# Patient Record
Sex: Male | Born: 1975 | Race: White | Hispanic: No | Marital: Single | Smoking: Current every day smoker
Health system: Southern US, Community
[De-identification: ages and names within clinical notes are randomized; demographics above are authoritative.]

## PROBLEM LIST (undated history)

## (undated) DIAGNOSIS — T4145XA Adverse effect of unspecified anesthetic, initial encounter: Secondary | ICD-10-CM

## (undated) DIAGNOSIS — R51 Headache: Secondary | ICD-10-CM

## (undated) DIAGNOSIS — T8859XA Other complications of anesthesia, initial encounter: Secondary | ICD-10-CM

## (undated) HISTORY — PX: HAND TENDON SURGERY: SHX663

## (undated) HISTORY — PX: TOOTH EXTRACTION: SUR596

---

## 2012-05-26 ENCOUNTER — Emergency Department (HOSPITAL_COMMUNITY)
Admission: EM | Admit: 2012-05-26 | Discharge: 2012-05-26 | Disposition: A | Discharge status: Home / Self Care | Payer: Self-pay | Attending: Emergency Medicine | Admitting: Emergency Medicine

## 2012-05-26 ENCOUNTER — Encounter (HOSPITAL_COMMUNITY): Payer: Self-pay | Admitting: *Deleted

## 2012-05-26 DIAGNOSIS — F17200 Nicotine dependence, unspecified, uncomplicated: Secondary | ICD-10-CM | POA: Insufficient documentation

## 2012-05-26 DIAGNOSIS — K029 Dental caries, unspecified: Secondary | ICD-10-CM

## 2012-05-26 DIAGNOSIS — K0889 Other specified disorders of teeth and supporting structures: Secondary | ICD-10-CM

## 2012-05-26 MED ORDER — AMOXICILLIN 500 MG PO CAPS: 500.0000 mg | ORAL_CAPSULE | Freq: Three times a day (TID) | ORAL | Status: AC

## 2012-05-26 MED ORDER — TRAMADOL HCL 50 MG PO TABS: 50.0000 mg | ORAL_TABLET | Freq: Four times a day (QID) | ORAL | Status: AC | PRN

## 2012-05-26 NOTE — ED Provider Notes (Signed)
History     CSN: [id number]  Arrival date & time 05/26/12  1191   First MD Initiated Contact with Patient 05/26/12 0935      Chief Complaint  Patient presents with  . Facial Pain    (Consider location/radiation/quality/duration/timing/severity/associated sxs/prior treatment) HPI Comments: Warren Duran is a 36 y.o. Male who presents with complaint of a pain to the right upper jaw. States pain started 1 week ago, worsening. Pt states he has hx of a 'cyst' that was removed from right nostril/sinus few years ago. States this pain is differnet, also having pain in the gum, swelling over gums. States " i have bad teeth." Pain with chewing.  Denies fever, chills, facial swelling, malaise. No medications taken.   The history is provided by the patient.    History reviewed. No pertinent past medical history.  History reviewed. No pertinent past surgical history.  History reviewed. No pertinent family history.  History  Substance Use Topics  . Smoking status: Current Everyday Smoker  . Smokeless tobacco: Not on file  . Alcohol Use: Yes     occ      Review of Systems  Constitutional: Negative for fever and chills.  HENT: Positive for dental problem. Negative for sore throat, mouth sores and neck pain.   Respiratory: Negative.   Cardiovascular: Negative.   Gastrointestinal: Negative.   Skin: Negative.   Neurological: Negative for dizziness, weakness and numbness.    Allergies  Review of patient's allergies indicates not on file.  Home Medications  No current outpatient prescriptions on file.  BP 131/88  Pulse 88  Temp 97.8 F (36.6 C) (Oral)  Resp 16  SpO2 100%  Physical Exam  Nursing note and vitals reviewed. Constitutional: He is oriented to person, place, and time. He appears well-developed and well-nourished. No distress.  HENT:       Wide spread dental decay. Multiple teeth missing, with only few left, and the ones that are left are decayed. Gingivitis. No  obvious abscess. Tender to palpation over right upper gum. Normal nasal cavity. Normal TMs bilaterlaly. Tender over right maxillary sinus.   Eyes: Conjunctivae are normal.  Neck: Neck supple.  Cardiovascular: Normal rate, regular rhythm and normal heart sounds.   Pulmonary/Chest: Effort normal and breath sounds normal. No respiratory distress. He has no wheezes. He has no rales.  Musculoskeletal: He exhibits no edema.  Lymphadenopathy:    He has no cervical adenopathy.  Neurological: He is alert and oriented to person, place, and time.  Skin: Skin is warm and dry.  Psychiatric: He has a normal mood and affect.    ED Course  Procedures (including critical care time)  10:12 AM Pt seen and examined. Poor dentition. Widespread dental decay. Tender over right upper gum. No signs of an abscess. Will start on antibiotics. Follow up with a dentist. Pt is non toxic. No distress.  1. Pain, dental   2. Dental decay       MDM          Lottie Mussel, PA 05/26/12 1623

## 2012-05-26 NOTE — ED Notes (Signed)
Pt reports 3-4 day hx of right sided maxofacial pain. States i had cyst removed from nasal cavity or it could be a tooth infection. Pt with dental carries noted. No fevers. Reports swelling.

## 2012-05-27 NOTE — ED Provider Notes (Signed)
Medical screening examination/treatment/procedure(s) were performed by non-physician practitioner and as supervising physician I was immediately available for consultation/collaboration.  Martha K Linker, MD 05/27/12 0915 

## 2012-05-28 ENCOUNTER — Encounter (HOSPITAL_COMMUNITY): Payer: Self-pay | Admitting: Emergency Medicine

## 2012-05-28 ENCOUNTER — Emergency Department (HOSPITAL_COMMUNITY)
Admission: EM | Admit: 2012-05-28 | Discharge: 2012-05-28 | Disposition: A | Discharge status: Home / Self Care | Payer: Self-pay | Attending: Emergency Medicine | Admitting: Emergency Medicine

## 2012-05-28 DIAGNOSIS — F17200 Nicotine dependence, unspecified, uncomplicated: Secondary | ICD-10-CM | POA: Insufficient documentation

## 2012-05-28 DIAGNOSIS — K047 Periapical abscess without sinus: Secondary | ICD-10-CM | POA: Insufficient documentation

## 2012-05-28 NOTE — ED Provider Notes (Signed)
History     CSN: [id number]  Arrival date & time 05/28/12  0136   First MD Initiated Contact with Patient 05/28/12 [phone]      Chief Complaint  Patient presents with  . Dental Pain    (Consider location/radiation/quality/duration/timing/severity/associated sxs/prior treatment) HPI Comments: Patient was seen and treated 2 days ago for a dental abscess.  He has horrible dentition with many, many missing teeth.  The ones that are still in his mouth which consists of left canine, upper, lower second left incisor.  Lower right first molar overall extensively decayed.  He also has extensive gum swelling at the location where the, central right incisor, upper should be  Patient is a 36 y.o. male presenting with tooth pain. The history is provided by the patient.  Dental PainThe primary symptoms include mouth pain. Primary symptoms do not include dental injury or fever. The symptoms began 3 to 5 days ago. The symptoms are worsening. The symptoms occur constantly.    History reviewed. No pertinent past medical history.  History reviewed. No pertinent past surgical history.  No family history on file.  History  Substance Use Topics  . Smoking status: Current Everyday Smoker  . Smokeless tobacco: Not on file  . Alcohol Use: Yes     occ      Review of Systems  Constitutional: Negative for fever and activity change.  HENT: Positive for dental problem. Negative for sinus pressure.   Gastrointestinal: Negative for nausea.  Neurological: Negative for dizziness.    Allergies  Review of patient's allergies indicates no known allergies.  Home Medications   Current Outpatient Rx  Name Route Sig Dispense Refill  . AMOXICILLIN 500 MG PO CAPS Oral Take 1 capsule (500 mg total) by mouth 3 (three) times daily. 21 capsule 0  . IBUPROFEN 200 MG PO TABS Oral Take 600 mg by mouth every 6 (six) hours as needed. For pain    . TRAMADOL HCL 50 MG PO TABS Oral Take 1 tablet (50 mg total) by mouth  every 6 (six) hours as needed for pain. 15 tablet 0    BP 144/83  Temp 99 F (37.2 C) (Oral)  Resp 14  SpO2 95%  Physical Exam  Constitutional: He appears well-nourished.  HENT:  Head: Normocephalic.       Patient has a bulbous abscess in the area where the, right front upper central incisor.  Should be with pain and slight swelling of to the corner of the right near  Eyes: Pupils are equal, round, and reactive to light.  Neck: Normal range of motion.  Cardiovascular: Normal rate.   Pulmonary/Chest: Effort normal.  Musculoskeletal: Normal range of motion.  Neurological: He is alert.  Skin: Skin is warm.    ED Course  Dental Date/Time: 05/28/2012 4:35 AM Performed by: Arman Filter Authorized by: Arman Filter Consent: Verbal consent obtained. Risks and benefits: risks, benefits and alternatives were discussed Consent given by: patient Patient understanding: patient states understanding of the procedure being performed Patient identity confirmed: verbally with patient Time out: Immediately prior to procedure a "time out" was called to verify the correct patient, procedure, equipment, support staff and site/side marked as required. Preparation: Patient was prepped and draped in the usual sterile fashion. Local anesthesia used: yes Local anesthetic: bupivacaine 0.5% without epinephrine and topical anesthetic Anesthetic total: 2 ml Patient sedated: no Patient tolerance: Patient tolerated the procedure well with no immediate complications. Comments: Abscess open and drained   (including critical care  time)  Labs Reviewed - No data to display No results found.   1. Dental abscess       MDM   Dental abscess A dental abscess was numbed with bupivacaine 0.5% 2 cc injected directly into the area over the right frontal central incisor and 11 blade was used to open.  The area.  A moderate amount of pertinent body material was suctioned out.  Patient felt relief after  procedure he was instructed to finish his antibiotics to use warm compresses 4-5 times a day for the next 2-3, days, and to followup with the dentist on call Timmie Foerster, NP 05/28/12 0439  Arman Filter, NP 05/28/12 [phone]

## 2012-05-28 NOTE — ED Notes (Signed)
NURSE FIRST ROUNDS: PT. SLEEPING WITH NO DISTRESS/ RESPIRATIONS UNLABORED.

## 2012-05-28 NOTE — ED Provider Notes (Signed)
Medical screening examination/treatment/procedure(s) were performed by non-physician practitioner and as supervising physician I was immediately available for consultation/collaboration.   Malayshia All, MD 05/28/12 0758 

## 2012-05-28 NOTE — ED Notes (Signed)
PT. REPORTS PERSISTENT/PROGRESSING PAIN AND SWELLING OF RIGHT UPPER MOLAR , SEEN HERE PRESCRIBED WITH ANTIBIOTIC AND PAIN MEDICATION WITH NO RELIEF.

## 2012-08-16 ENCOUNTER — Emergency Department (HOSPITAL_COMMUNITY)
Admission: EM | Admit: 2012-08-16 | Discharge: 2012-08-16 | Discharge status: Against Medical Advice | Payer: Self-pay | Attending: Emergency Medicine | Admitting: Emergency Medicine

## 2012-08-16 DIAGNOSIS — Y929 Unspecified place or not applicable: Secondary | ICD-10-CM | POA: Insufficient documentation

## 2012-08-16 DIAGNOSIS — S61409A Unspecified open wound of unspecified hand, initial encounter: Secondary | ICD-10-CM | POA: Insufficient documentation

## 2012-08-16 DIAGNOSIS — X58XXXA Exposure to other specified factors, initial encounter: Secondary | ICD-10-CM | POA: Insufficient documentation

## 2012-08-16 DIAGNOSIS — F17200 Nicotine dependence, unspecified, uncomplicated: Secondary | ICD-10-CM | POA: Insufficient documentation

## 2012-08-16 DIAGNOSIS — Y939 Activity, unspecified: Secondary | ICD-10-CM | POA: Insufficient documentation

## 2012-08-16 NOTE — ED Notes (Signed)
Pt did not answer.

## 2012-08-16 NOTE — ED Notes (Signed)
No answer

## 2012-08-17 ENCOUNTER — Encounter (HOSPITAL_COMMUNITY): Payer: Self-pay | Admitting: Physical Medicine and Rehabilitation

## 2012-08-17 ENCOUNTER — Emergency Department (HOSPITAL_COMMUNITY)
Admission: EM | Admit: 2012-08-17 | Discharge: 2012-08-17 | Disposition: A | Discharge status: Home / Self Care | Payer: Self-pay | Attending: Emergency Medicine | Admitting: Emergency Medicine

## 2012-08-17 ENCOUNTER — Emergency Department (HOSPITAL_COMMUNITY): Payer: Self-pay

## 2012-08-17 DIAGNOSIS — S62309B Unspecified fracture of unspecified metacarpal bone, initial encounter for open fracture: Secondary | ICD-10-CM | POA: Insufficient documentation

## 2012-08-17 DIAGNOSIS — S61409A Unspecified open wound of unspecified hand, initial encounter: Secondary | ICD-10-CM | POA: Insufficient documentation

## 2012-08-17 DIAGNOSIS — Y929 Unspecified place or not applicable: Secondary | ICD-10-CM | POA: Insufficient documentation

## 2012-08-17 DIAGNOSIS — W268XXA Contact with other sharp object(s), not elsewhere classified, initial encounter: Secondary | ICD-10-CM | POA: Insufficient documentation

## 2012-08-17 DIAGNOSIS — S61419A Laceration without foreign body of unspecified hand, initial encounter: Secondary | ICD-10-CM

## 2012-08-17 DIAGNOSIS — Y939 Activity, unspecified: Secondary | ICD-10-CM | POA: Insufficient documentation

## 2012-08-17 DIAGNOSIS — F17200 Nicotine dependence, unspecified, uncomplicated: Secondary | ICD-10-CM | POA: Insufficient documentation

## 2012-08-17 MED ORDER — OXYCODONE-ACETAMINOPHEN 5-325 MG PO TABS
1.0000 | ORAL_TABLET | Freq: Once | ORAL | Status: AC
  Administered 2012-08-17: 1 via ORAL
  Filled 2012-08-17: qty 1

## 2012-08-17 MED ORDER — CEFAZOLIN SODIUM 1-5 GM-% IV SOLN
1.0000 g | Freq: Once | INTRAVENOUS | Status: AC
  Administered 2012-08-17: 1 g via INTRAVENOUS
  Filled 2012-08-17: qty 50

## 2012-08-17 MED ORDER — CEPHALEXIN 500 MG PO CAPS: 500.0000 mg | ORAL_CAPSULE | Freq: Four times a day (QID) | ORAL | Status: DC

## 2012-08-17 MED ORDER — OXYCODONE-ACETAMINOPHEN 5-325 MG PO TABS: 1.0000 | ORAL_TABLET | Freq: Four times a day (QID) | ORAL | Status: DC | PRN

## 2012-08-17 NOTE — ED Provider Notes (Addendum)
History    This chart was scribed for Gwyneth Sprout, MD, MD by Smitty Pluck, ED Scribe. The patient was seen in room TR08C/TR08C and the patient's care was started at 11:11AM.   CSN: [id number]  Arrival date & time 08/17/12  1022   First MD Initiated Contact with Patient 08/17/12 1110      Chief Complaint  Patient presents with  . Laceration    (Consider location/radiation/quality/duration/timing/severity/associated sxs/prior treatment) Patient is a 36 y.o. male presenting with skin laceration. The history is provided by the patient. No language interpreter was used.  Laceration    Warren Duran is a 36 y.o. male who presents to the Emergency Department complaining of moderate hand laceration onset 1 day ago at 5 PM. Pt was cutting sheet metal and a piece slipped and cut the back of his right hand. He reports that he is unable to straighten his fingers in his right hand but he is able to wiggle them. Denies any other medical complication and any other current pain.   No past medical history on file.  No past surgical history on file.  History reviewed. No pertinent family history.  History  Substance Use Topics  . Smoking status: Current Every Day Smoker    Types: Cigarettes  . Smokeless tobacco: Not on file  . Alcohol Use: Yes     Comment: occ      Review of Systems  Constitutional: Negative for fever and chills.  Respiratory: Negative for shortness of breath.   Gastrointestinal: Negative for nausea and vomiting.  Neurological: Negative for weakness.  All other systems reviewed and are negative.    Allergies  Review of patient's allergies indicates no known allergies.  Home Medications  No current outpatient prescriptions on file.  BP 122/79  Pulse 88  Temp 98.1 F (36.7 C) (Oral)  Resp 20  SpO2 99%  Physical Exam  Nursing note and vitals reviewed. Constitutional: He is oriented to person, place, and time. He appears well-developed and  well-nourished. No distress.  HENT:  Head: Normocephalic and atraumatic.  Eyes: EOM are normal.  Neck: Neck supple. No tracheal deviation present.  Cardiovascular: Normal rate.   Pulmonary/Chest: Effort normal. No respiratory distress.  Musculoskeletal: Normal range of motion.       5 cm lac over dorsal aspect of right hand Unable to ext fingers 3 and 4 Flexion of DIP  Nl sensation of 4th digit but numbness over distal 3rd Nl thumb, 1st and 4th finger Mild bleeding Nl pulse   Neurological: He is alert and oriented to person, place, and time.  Skin: Skin is warm and dry.  Psychiatric: He has a normal mood and affect. His behavior is normal.    ED Course  Procedures (including critical care time) DIAGNOSTIC STUDIES: Oxygen Saturation is 99% on room air, normal by my interpretation.    COORDINATION OF CARE: 11:15 AM Discussed ED treatment with pt  11:29 AM Ordered:    . [COMPLETED] oxyCODONE-acetaminophen  1 tablet Oral Once       Labs Reviewed - No data to display Dg Hand Complete Right  08/17/2012  *RADIOLOGY REPORT*  Clinical Data: Laceration right hand  RIGHT HAND - COMPLETE 3+ VIEW  Comparison: None.  Findings: Irregularity of the head of the third metacarpal.  This is seen on three views views and is concerning for a fracture. There is soft tissue swelling of the dorsum of the hand.  IMPRESSION: Fracture of the head of the third metacarpal at the  MCP joint.   Original Report Authenticated By: Genevive Bi, M.D.      1. Laceration of hand with tendon involvement   2. Fracture metacarpal-open       MDM  patient with a laceration to the dorsal surface of his right hand. There is evidence of extensor tendon damage to the fourth and third finger, and decreased sensation to the third distal digit. He is neurovascularly intact with a normal pulse. Tetanus shot is up-to-date. Plain films pending. This occurred approximately 12 hours ago.  12:11 PM Plain film shows a  fracture of the head of the third metacarpal at the MCP joint making this an open fracture. Patient was given 1 g of Ancef and hand surgery was consult.  12:36 PM Spoke with Dr. Mina Marble and due to age of the injury he suggested wash out and f/u.  Will clean wound splint and have f/u.  I personally performed the services described in this documentation, which was scribed in my presence.  The recorded information has been reviewed and considered.       Gwyneth Sprout, MD 08/17/12 1136  Gwyneth Sprout, MD 08/17/12 1212  Gwyneth Sprout, MD 08/17/12 1610  Gwyneth Sprout, MD 08/17/12 1244

## 2012-08-17 NOTE — ED Provider Notes (Signed)
Medical screening examination/treatment/procedure(s) were conducted as a shared visit with non-physician practitioner(s) and myself.  I personally evaluated the patient during the encounter   Gwyneth Sprout, MD 08/17/12 [phone]

## 2012-08-17 NOTE — ED Notes (Signed)
Hand elevated on pillows.

## 2012-08-17 NOTE — ED Notes (Signed)
Provider at bedside.  Washing out wound.

## 2012-08-17 NOTE — ED Notes (Signed)
Ortho called to apply splint. 

## 2012-08-17 NOTE — ED Notes (Signed)
Friend to bedside

## 2012-08-17 NOTE — ED Provider Notes (Signed)
Patient seen by Dr Anitra Lauth.  I was asked to repair laceration of right dorsal hand.  Per Dr Anitra Lauth, laceration is >12 hours old and is overlying fracture.  She has spoken with hand surgeon who requests loose approximation of skin.    LACERATION REPAIR Performed by: Trixie Dredge B Authorized by: Trixie Dredge B Consent: Verbal consent obtained. Risks and benefits: risks, benefits and alternatives were discussed Consent given by: patient Patient identity confirmed: provided demographic data Prepped and Draped in normal sterile fashion Wound explored  Laceration Location: right dorsal hand  Laceration Length: 5cm  No Foreign Bodies seen or palpated  Anesthesia: local infiltration  Local anesthetic: lidocaine 2% no epinephrine  Anesthetic total: 12 ml  Irrigation method: syringe irrigation, in part with combiguard Amount of cleaning: extensive, approximately 1L used  Skin closure: 4-0 ethilon  Number of sutures: 9  Technique: simple interrupted, very loosely approximated.    Patient tolerance: Patient tolerated the procedure well with no immediate complications.   [place], [place] 08/17/12 1359

## 2012-08-17 NOTE — ED Notes (Signed)
Pt was cutting sheet metal and fell on sheet metal and cut back of right hand across 3 fingers, , limited movement of fingers, dressing applied in triage, last tetanus shot 4 years ago

## 2012-08-17 NOTE — ED Notes (Signed)
Pt c/o tingling at fingers

## 2012-08-17 NOTE — Progress Notes (Signed)
Orthopedic Tech Progress Note Patient Details:  Warren Duran 1976/10/01 [id number] Spoke with ER doctor about specific orders for this patient. Doctor requested splint that would keep fingers straight and stabilize. Decided to splint ant buddy tape 3rd and 4th digits and apply Sugartong splint extending up to fingertips. Dressing applied over laceration to protect before applying splint.   Ortho Devices Type of Ortho Device: Sugartong splint Ortho Device/Splint Location: Right UE Ortho Device/Splint Interventions: Application   Asia R Thompson 08/17/2012, 1:58 PM

## 2012-08-17 NOTE — ED Notes (Signed)
Pt clothing removed. And instructed to stay NPO

## 2012-08-17 NOTE — ED Notes (Signed)
Ortho at bedside applying splint

## 2012-08-17 NOTE — ED Notes (Signed)
Pt presents to department for evaluation of R hand laceration. States he was cutting sheet metal when a piece slipped and cut his hand. 2 inch laceration noted to top of hand, bleeding upon arrival. Pt states numbness/tingling to finger. Able to wiggle digits. He is alert and oriented x4.

## 2012-08-17 NOTE — ED Notes (Signed)
Pt is in X-ray at this time.

## 2012-08-17 NOTE — ED Notes (Signed)
Pt informed that he will have to find a ride home after being medicated. Pt agrees

## 2012-08-17 NOTE — ED Notes (Signed)
Neosporin and sterile dressing applied to right hand.

## 2012-08-20 NOTE — Progress Notes (Signed)
I called three times this evening- no answer.  I left instructions on voice mail- arrival time, NPO, [place] parking, no jewelry, lotions, powders, colones, medications he may take- Percocet and Keflex  And SS phone number.

## 2012-08-21 ENCOUNTER — Other Ambulatory Visit: Payer: Self-pay | Admitting: Orthopedic Surgery

## 2012-08-21 ENCOUNTER — Encounter (HOSPITAL_COMMUNITY): Payer: Self-pay | Admitting: Anesthesiology

## 2012-08-21 ENCOUNTER — Encounter (HOSPITAL_COMMUNITY): Payer: Self-pay

## 2012-08-21 ENCOUNTER — Ambulatory Visit (HOSPITAL_COMMUNITY)
Admission: RE | Admit: 2012-08-21 | Discharge: 2012-08-22 | Disposition: A | Discharge status: Home / Self Care | Payer: Self-pay | Source: Ambulatory Visit | Attending: Orthopedic Surgery | Admitting: Orthopedic Surgery

## 2012-08-21 ENCOUNTER — Ambulatory Visit (HOSPITAL_COMMUNITY): Payer: Self-pay | Admitting: Anesthesiology

## 2012-08-21 ENCOUNTER — Encounter (HOSPITAL_COMMUNITY)
Admission: RE | Disposition: A | Discharge status: Home / Self Care | Payer: Self-pay | Source: Ambulatory Visit | Attending: Orthopedic Surgery

## 2012-08-21 DIAGNOSIS — S62309B Unspecified fracture of unspecified metacarpal bone, initial encounter for open fracture: Secondary | ICD-10-CM

## 2012-08-21 DIAGNOSIS — X58XXXA Exposure to other specified factors, initial encounter: Secondary | ICD-10-CM | POA: Insufficient documentation

## 2012-08-21 DIAGNOSIS — S61409A Unspecified open wound of unspecified hand, initial encounter: Secondary | ICD-10-CM | POA: Insufficient documentation

## 2012-08-21 DIAGNOSIS — S62339A Displaced fracture of neck of unspecified metacarpal bone, initial encounter for closed fracture: Secondary | ICD-10-CM | POA: Insufficient documentation

## 2012-08-21 HISTORY — DX: Other complications of anesthesia, initial encounter: T88.59XA

## 2012-08-21 HISTORY — DX: Headache: R51

## 2012-08-21 HISTORY — PX: I & D EXTREMITY: SHX5045

## 2012-08-21 HISTORY — DX: Adverse effect of unspecified anesthetic, initial encounter: T41.45XA

## 2012-08-21 LAB — CBC
HCT: 42.1 % (ref 39.0–52.0)
MCH: 32.9 pg (ref 26.0–34.0)
MCV: 94.8 fL (ref 78.0–100.0)
Platelets: 267 10*3/uL (ref 150–400)
RBC: 4.44 MIL/uL (ref 4.22–5.81)
RDW: 13.1 % (ref 11.5–15.5)

## 2012-08-21 SURGERY — IRRIGATION AND DEBRIDEMENT EXTREMITY
Anesthesia: General | Site: Hand | Laterality: Right | Wound class: Clean

## 2012-08-21 MED ORDER — ENOXAPARIN SODIUM 30 MG/0.3ML ~~LOC~~ SOLN: 30.0000 mg | Freq: Two times a day (BID) | SUBCUTANEOUS | Status: DC

## 2012-08-21 MED ORDER — PROPOFOL 10 MG/ML IV BOLUS
INTRAVENOUS | Status: DC | PRN
  Administered 2012-08-21: 170 mg via INTRAVENOUS
  Administered 2012-08-21: 30 mg via INTRAVENOUS

## 2012-08-21 MED ORDER — ONDANSETRON HCL 4 MG/2ML IJ SOLN: 4.0000 mg | Freq: Four times a day (QID) | INTRAMUSCULAR | Status: DC | PRN

## 2012-08-21 MED ORDER — OXYCODONE-ACETAMINOPHEN 5-325 MG PO TABS: 1.0000 | ORAL_TABLET | ORAL | Status: DC | PRN

## 2012-08-21 MED ORDER — MUPIROCIN 2 % EX OINT
TOPICAL_OINTMENT | Freq: Once | CUTANEOUS | Status: AC
  Administered 2012-08-21: 1 via NASAL

## 2012-08-21 MED ORDER — MUPIROCIN 2 % EX OINT
TOPICAL_OINTMENT | CUTANEOUS | Status: AC
  Administered 2012-08-21: 1 via NASAL
  Filled 2012-08-21: qty 22

## 2012-08-21 MED ORDER — DIPHENHYDRAMINE HCL 25 MG PO CAPS: 25.0000 mg | ORAL_CAPSULE | Freq: Four times a day (QID) | ORAL | Status: DC | PRN

## 2012-08-21 MED ORDER — OXYCODONE HCL 5 MG PO TABS
5.0000 mg | ORAL_TABLET | Freq: Once | ORAL | Status: AC | PRN
  Administered 2012-08-21: 5 mg via ORAL

## 2012-08-21 MED ORDER — MIDAZOLAM HCL 5 MG/5ML IJ SOLN
INTRAMUSCULAR | Status: DC | PRN
  Administered 2012-08-21: 2 mg via INTRAVENOUS

## 2012-08-21 MED ORDER — HYDROMORPHONE HCL PF 1 MG/ML IJ SOLN
0.5000 mg | INTRAMUSCULAR | Status: DC | PRN
  Administered 2012-08-21 – 2012-08-22 (×2): 1 mg via INTRAVENOUS
  Filled 2012-08-21 (×2): qty 1

## 2012-08-21 MED ORDER — OXYCODONE HCL 5 MG/5ML PO SOLN: 5.0000 mg | Freq: Once | ORAL | Status: AC | PRN

## 2012-08-21 MED ORDER — DEXTROSE-NACL 5-0.9 % IV SOLN
INTRAVENOUS | Status: DC
  Administered 2012-08-22: via INTRAVENOUS

## 2012-08-21 MED ORDER — FENTANYL CITRATE 0.05 MG/ML IJ SOLN
INTRAMUSCULAR | Status: DC | PRN
  Administered 2012-08-21: 50 ug via INTRAVENOUS
  Administered 2012-08-21: 100 ug via INTRAVENOUS
  Administered 2012-08-21 (×2): 50 ug via INTRAVENOUS

## 2012-08-21 MED ORDER — CEFAZOLIN SODIUM 1-5 GM-% IV SOLN
INTRAVENOUS | Status: AC
  Filled 2012-08-21: qty 50

## 2012-08-21 MED ORDER — ONDANSETRON HCL 4 MG/2ML IJ SOLN
INTRAMUSCULAR | Status: DC | PRN
  Administered 2012-08-21: 4 mg via INTRAVENOUS

## 2012-08-21 MED ORDER — HYDROMORPHONE HCL PF 1 MG/ML IJ SOLN
0.2500 mg | INTRAMUSCULAR | Status: DC | PRN
  Administered 2012-08-21 (×3): 0.5 mg via INTRAVENOUS

## 2012-08-21 MED ORDER — OXYCODONE-ACETAMINOPHEN 5-325 MG PO TABS
1.0000 | ORAL_TABLET | ORAL | Status: DC | PRN
  Administered 2012-08-22 (×2): 2 via ORAL
  Filled 2012-08-21 (×2): qty 2

## 2012-08-21 MED ORDER — BUPIVACAINE HCL (PF) 0.25 % IJ SOLN
INTRAMUSCULAR | Status: DC | PRN
  Administered 2012-08-21: 10 mL

## 2012-08-21 MED ORDER — CHLORHEXIDINE GLUCONATE 4 % EX LIQD: 60.0000 mL | Freq: Once | CUTANEOUS | Status: DC

## 2012-08-21 MED ORDER — FENTANYL CITRATE 0.05 MG/ML IJ SOLN: 50.0000 ug | Freq: Once | INTRAMUSCULAR | Status: DC

## 2012-08-21 MED ORDER — ONDANSETRON HCL 4 MG PO TABS: 4.0000 mg | ORAL_TABLET | Freq: Four times a day (QID) | ORAL | Status: DC | PRN

## 2012-08-21 MED ORDER — BUPIVACAINE-EPINEPHRINE PF 0.25-1:200000 % IJ SOLN
INTRAMUSCULAR | Status: AC
  Filled 2012-08-21: qty 30

## 2012-08-21 MED ORDER — SODIUM CHLORIDE 0.9 % IR SOLN
Status: DC | PRN
  Administered 2012-08-21: 1000 mL

## 2012-08-21 MED ORDER — HYDROMORPHONE HCL PF 1 MG/ML IJ SOLN
INTRAMUSCULAR | Status: AC
  Filled 2012-08-21: qty 1

## 2012-08-21 MED ORDER — LIDOCAINE HCL (CARDIAC) 20 MG/ML IV SOLN
INTRAVENOUS | Status: DC | PRN
  Administered 2012-08-21: 100 mg via INTRAVENOUS

## 2012-08-21 MED ORDER — LACTATED RINGERS IV SOLN
INTRAVENOUS | Status: DC
  Administered 2012-08-21: 16:00:00 via INTRAVENOUS

## 2012-08-21 MED ORDER — OXYCODONE HCL 5 MG PO TABS
ORAL_TABLET | ORAL | Status: AC
  Filled 2012-08-21: qty 1

## 2012-08-21 MED ORDER — MIDAZOLAM HCL 2 MG/2ML IJ SOLN: 1.0000 mg | INTRAMUSCULAR | Status: DC | PRN

## 2012-08-21 MED ORDER — CEFAZOLIN SODIUM 1-5 GM-% IV SOLN
INTRAVENOUS | Status: DC | PRN
  Administered 2012-08-21: 1 g via INTRAVENOUS

## 2012-08-21 MED ORDER — PROMETHAZINE HCL 25 MG/ML IJ SOLN: 6.2500 mg | INTRAMUSCULAR | Status: DC | PRN

## 2012-08-21 MED ORDER — ENOXAPARIN SODIUM 30 MG/0.3ML ~~LOC~~ SOLN
30.0000 mg | Freq: Two times a day (BID) | SUBCUTANEOUS | Status: DC
  Administered 2012-08-22: 30 mg via SUBCUTANEOUS
  Filled 2012-08-21 (×3): qty 0.3

## 2012-08-21 SURGICAL SUPPLY — 57 items
BANDAGE CONFORM 2  STR LF (GAUZE/BANDAGES/DRESSINGS) IMPLANT
BANDAGE ELASTIC 3 VELCRO ST LF (GAUZE/BANDAGES/DRESSINGS) ×2 IMPLANT
BANDAGE ELASTIC 4 VELCRO ST LF (GAUZE/BANDAGES/DRESSINGS) ×2 IMPLANT
BANDAGE GAUZE ELAST BULKY 4 IN (GAUZE/BANDAGES/DRESSINGS) ×2 IMPLANT
BIT DRILL 1.1 MINI QC NONSTRL (BIT) ×2 IMPLANT
BNDG CMPR 9X4 STRL LF SNTH (GAUZE/BANDAGES/DRESSINGS) ×1
BNDG ESMARK 4X9 LF (GAUZE/BANDAGES/DRESSINGS) ×2 IMPLANT
CLOTH BEACON ORANGE TIMEOUT ST (SAFETY) ×2 IMPLANT
CORDS BIPOLAR (ELECTRODE) ×2 IMPLANT
COVER SURGICAL LIGHT HANDLE (MISCELLANEOUS) ×2 IMPLANT
CUFF TOURNIQUET SINGLE 18IN (TOURNIQUET CUFF) ×2 IMPLANT
DECANTER SPIKE VIAL GLASS SM (MISCELLANEOUS) ×2 IMPLANT
DRAIN PENROSE 1/4X12 LTX STRL (WOUND CARE) IMPLANT
DRAPE OEC MINIVIEW 54X84 (DRAPES) ×2 IMPLANT
DRAPE SURG 17X23 STRL (DRAPES) ×2 IMPLANT
DRSG PAD ABDOMINAL 8X10 ST (GAUZE/BANDAGES/DRESSINGS) IMPLANT
DURAPREP 26ML APPLICATOR (WOUND CARE) IMPLANT
ELECT REM PT RETURN 9FT ADLT (ELECTROSURGICAL)
ELECTRODE REM PT RTRN 9FT ADLT (ELECTROSURGICAL) IMPLANT
GAUZE PACKING IODOFORM 1/4X5 (PACKING) IMPLANT
GAUZE XEROFORM 1X8 LF (GAUZE/BANDAGES/DRESSINGS) ×2 IMPLANT
GLOVE BIO SURGEON STRL SZ8.5 (GLOVE) ×2 IMPLANT
GOWN PREVENTION PLUS XLARGE (GOWN DISPOSABLE) ×2 IMPLANT
GOWN STRL NON-REIN LRG LVL3 (GOWN DISPOSABLE) ×2 IMPLANT
HANDPIECE INTERPULSE COAX TIP (DISPOSABLE)
KIT BASIN OR (CUSTOM PROCEDURE TRAY) ×2 IMPLANT
KIT ROOM TURNOVER OR (KITS) ×2 IMPLANT
MANIFOLD NEPTUNE II (INSTRUMENTS) IMPLANT
NEEDLE HYPO 25GX1X1/2 BEV (NEEDLE) ×2 IMPLANT
NEEDLE HYPO 25X1 1.5 SAFETY (NEEDLE) IMPLANT
NS IRRIG 1000ML POUR BTL (IV SOLUTION) ×2 IMPLANT
PACK ORTHO EXTREMITY (CUSTOM PROCEDURE TRAY) ×2 IMPLANT
PAD ARMBOARD 7.5X6 YLW CONV (MISCELLANEOUS) ×4 IMPLANT
PAD CAST 4YDX4 CTTN HI CHSV (CAST SUPPLIES) ×2 IMPLANT
PADDING CAST ABS 4INX4YD NS (CAST SUPPLIES) ×1
PADDING CAST ABS COTTON 4X4 ST (CAST SUPPLIES) ×1 IMPLANT
PADDING CAST COTTON 4X4 STRL (CAST SUPPLIES) ×4
SCREW 1.5X18MM (Screw) ×2 IMPLANT
SCREW BN 18X1.5XST NONLOCK (Screw) ×1 IMPLANT
SCREW NL 1.5X13 (Screw) ×2 IMPLANT
SET HNDPC FAN SPRY TIP SCT (DISPOSABLE) IMPLANT
SPONGE GAUZE 4X4 12PLY (GAUZE/BANDAGES/DRESSINGS) ×2 IMPLANT
SPONGE LAP 18X18 X RAY DECT (DISPOSABLE) ×2 IMPLANT
SUCTION FRAZIER TIP 10 FR DISP (SUCTIONS) ×2 IMPLANT
SUT FIBERWIRE 3-0 18 TAPR NDL (SUTURE) ×2
SUT VICRYL 3 0 (SUTURE) ×2 IMPLANT
SUT VICRYL RAPIDE 4/0 PS 2 (SUTURE) ×2 IMPLANT
SUTURE FIBERWR 3-0 18 TAPR NDL (SUTURE) ×1 IMPLANT
SYR 20CC LL (SYRINGE) IMPLANT
SYR CONTROL 10ML LL (SYRINGE) ×2 IMPLANT
TOWEL OR 17X24 6PK STRL BLUE (TOWEL DISPOSABLE) ×2 IMPLANT
TOWEL OR 17X26 10 PK STRL BLUE (TOWEL DISPOSABLE) ×2 IMPLANT
TUBE ANAEROBIC SPECIMEN COL (MISCELLANEOUS) IMPLANT
TUBE CONNECTING 12X1/4 (SUCTIONS) ×2 IMPLANT
UNDERPAD 30X30 INCONTINENT (UNDERPADS AND DIAPERS) ×2 IMPLANT
WATER STERILE IRR 1000ML POUR (IV SOLUTION) ×2 IMPLANT
YANKAUER SUCT BULB TIP NO VENT (SUCTIONS) ×2 IMPLANT

## 2012-08-21 NOTE — Brief Op Note (Signed)
08/21/2012  5:23 PM  PATIENT:  Warren Duran  36 y.o. male  PRE-OPERATIVE DIAGNOSIS:  RIGHT HAND DORSAL LACERATION  POST-OPERATIVE DIAGNOSIS:  RIGHT HAND DORSAL LACERATION  PROCEDURE:  Procedure(s) (LRB) with comments: IRRIGATION AND DEBRIDEMENT EXTREMITY (Right) - EXPLORATION/REPAIR DORSAL LACERATION OF RIGHT HAND, open reduction internal fixation right metacarpel.  SURGEON:  Surgeon(s) and Role:    * Marlowe Shores, MD - Primary  PHYSICIAN ASSISTANT: {  ASSISTANTS:  none  ANESTHESIA:   general  EBL:     BLOOD ADMINISTERED:none  DRAINS: none   LOCAL MEDICATIONS USED:  MARCAINE   10cc  SPECIMEN:  No Specimen  DISPOSITION OF SPECIMEN:  N/A  COUNTS:  YES  TOURNIQUET:  * Missing tourniquet times found for documented tourniquets in log:  70057 *  DICTATION: .Other Dictation: Dictation Number [id number]  PLAN OF CARE: Admit for overnight observation  PATIENT DISPOSITION:  PACU - hemodynamically stable.   Delay start of Pharmacological VTE agent (>24hrs) due to surgical blood loss or risk of bleeding: yes

## 2012-08-21 NOTE — Progress Notes (Signed)
Patient reports being homeless without a place to go tonight. Left message for Frann Rider, RN Case Manager. Contacted Dr. Gypsy Balsam and Dr. Mina Marble and made them aware of same.

## 2012-08-21 NOTE — Discharge Instructions (Signed)
D/c in am 11/14

## 2012-08-21 NOTE — Anesthesia Postprocedure Evaluation (Signed)
  Anesthesia Post-op Note  Patient: Warren Duran  Procedure(s) Performed: Procedure(s) (LRB) with comments: IRRIGATION AND DEBRIDEMENT EXTREMITY (Right) - EXPLORATION/REPAIR DORSAL LACERATION OF RIGHT HAND, open reduction internal fixation right metacarpel.  Patient Location: PACU  Anesthesia Type:General  Level of Consciousness: awake, alert  and oriented  Airway and Oxygen Therapy: Patient Spontanous Breathing  Post-op Pain: mild  Post-op Assessment: Post-op Vital signs reviewed and Patient's Cardiovascular Status Stable  Post-op Vital Signs: stable  Complications: No apparent anesthesia complications

## 2012-08-21 NOTE — Transfer of Care (Signed)
Immediate Anesthesia Transfer of Care Note  Patient: Warren Duran  Procedure(s) Performed: Procedure(s) (LRB) with comments: IRRIGATION AND DEBRIDEMENT EXTREMITY (Right) - EXPLORATION/REPAIR DORSAL LACERATION OF RIGHT HAND, open reduction internal fixation right metacarpel.  Patient Location: PACU  Anesthesia Type:General  Level of Consciousness: awake, alert  and oriented  Airway & Oxygen Therapy: Patient Spontanous Breathing and Patient connected to nasal cannula oxygen  Post-op Assessment: Report given to PACU RN, Post -op Vital signs reviewed and stable and Patient moving all extremities X 4  Post vital signs: Reviewed and stable  Complications: No apparent anesthesia complications

## 2012-08-21 NOTE — Progress Notes (Signed)
Called patient and request that he contact short stay. Initial contact made by [facility] and patient informed them he would arrive by 13:15. No answer when called went straight to VM

## 2012-08-21 NOTE — Anesthesia Preprocedure Evaluation (Addendum)
Anesthesia Evaluation  Patient identified by MRN, date of birth, ID band Patient awake    Reviewed: Allergy & Precautions, H&P , NPO status , Patient's Chart, lab work & pertinent test results  Airway Mallampati: I TM Distance: >3 FB Neck ROM: Full    Dental   Pulmonary          Cardiovascular Rhythm:Regular Rate:Normal     Neuro/Psych  Headaches,    GI/Hepatic   Endo/Other    Renal/GU      Musculoskeletal   Abdominal   Peds  Hematology   Anesthesia Other Findings   Reproductive/Obstetrics                          Anesthesia Physical Anesthesia Plan  ASA: II  Anesthesia Plan: General   Post-op Pain Management:    Induction: Intravenous  Airway Management Planned: LMA  Additional Equipment:   Intra-op Plan:   Post-operative Plan: Extubation in OR  Informed Consent: I have reviewed the patients History and Physical, chart, labs and discussed the procedure including the risks, benefits and alternatives for the proposed anesthesia with the patient or authorized representative who has indicated his/her understanding and acceptance.     Plan Discussed with: CRNA and Surgeon  Anesthesia Plan Comments:         Anesthesia Quick Evaluation

## 2012-08-21 NOTE — Progress Notes (Signed)
Spoke  With [employer] in Dr. Ronie Spies office about patient's living conditioins, [place] with others around him but no one with him to help out and no transportation.  She stated surgery would have to be rescheduled unless social work could arrange something because patient could not spend the night in the hospital and could not go home alone. Spoke with Jodie, social work, she will not be able to arrange anything at this short notice so patient will need to reschedule and call social work ahead of time to make arrangements.  Pam from holding area called with Dr. Mina Marble at her side and states that Dr. Mina Marble will admit patient 23 hour obs.

## 2012-08-21 NOTE — Progress Notes (Signed)
Clinical Social Work Department BRIEF PSYCHOSOCIAL ASSESSMENT 08/21/2012  Patient:  Warren Duran, Warren Duran     Account Number:  [id number]     Admit date:  08/21/2012  Clinical Social Worker:  Illene Silver  Date/Time:  08/21/2012 10:14 PM  Referred by:  RN  Date Referred:  08/21/2012 Referred for  Homelessness   Other Referral:   Interview type:  Patient Other interview type:    PSYCHOSOCIAL DATA Living Status:  OTHER Admitted from facility:   Level of care:   Primary support name:   Primary support relationship to patient:   Degree of support available:   Pt has no local support.  Sister in [place] with whom he plans to live in future.  Pt unable to live with sister now because he would not be able to get to his f/u appts.    CURRENT CONCERNS Current Concerns  Other - See comment   Other Concerns:   Pt is homeless.  Lives in "[place], [place]."  MD concerned about pt d/c to woods and caring for his hand, per referring RN.    SOCIAL WORK ASSESSMENT / PLAN Received referral from [facility] RN.  Pt homeless and nowhere to go at d/c.  Pt states that he is not allowed to go back to [employer].  Pt has no local friends/family with whom he can stay.  Pt has f/u appts. in [place], and would not be able to get to them [place].  CSW to determine if pt is eligible for [employer] or financial assistance through [facility] office to pay for ST lodging.   Assessment/plan status:  Psychosocial Support/Ongoing Assessment of Needs Other assessment/ plan:   CSW will f/u with pt prior to his d/c on ? 08/22/12.   Information/referral to community resources:    PATIENT'S/FAMILY'S RESPONSE TO PLAN OF CARE: Pt very pleasant.  He is very thankful that he was able to get his hand fixed.

## 2012-08-21 NOTE — H&P (Signed)
Warren Duran is an 36 y.o. male.   Chief Complaint: right hand laceration HPI: as above with deep laceration to dorsal aspect of right hand with loss of extension  Past Medical History  Diagnosis Date  . Complication of anesthesia     patient may fight back with anesthesia  . Headache     migraines last one 1 yr ago    Past Surgical History  Procedure Date  . Tooth extraction >2 yrs ago for last one     10 years ago for original surgery  . Hand tendon surgery     left hand    Family History  Problem Relation Age of Onset  . Cancer - Lung Mother   . Other Other   . Heart disease Other   . Cancer - Lung Other   . Other Sister    Social History:  reports that he has been smoking Cigarettes.  He has a 24 pack-year smoking history. He does not have any smokeless tobacco history on file. He reports that he uses illicit drugs (Marijuana) about once per week. He reports that he does not drink alcohol.  Allergies: No Known Allergies  Medications Prior to Admission  Medication Sig Dispense Refill  . cephALEXin (KEFLEX) 500 MG capsule Take 500 mg by mouth 4 (four) times daily. Started 11.9.13 for 7 days ending 11.16.13      . oxyCODONE-acetaminophen (PERCOCET/ROXICET) 5-325 MG per tablet Take 1 tablet by mouth every 6 (six) hours as needed for pain.  15 tablet  0    Results for orders placed during the hospital encounter of 08/21/12 (from the past 48 hour(s))  CBC     Status: Normal   Collection Time   08/21/12  2:38 PM      Component Value Range Comment   WBC 8.6  4.0 - 10.5 K/uL    RBC 4.44  4.22 - 5.81 MIL/uL    Hemoglobin 14.6  13.0 - 17.0 g/dL    HCT 16.1  09.6 - 04.5 %    MCV 94.8  78.0 - 100.0 fL    MCH 32.9  26.0 - 34.0 pg    MCHC 34.7  30.0 - 36.0 g/dL    RDW 40.9  81.1 - 91.4 %    Platelets 267  150 - 400 K/uL    No results found.  Review of Systems  All other systems reviewed and are negative.    There were no vitals taken for this visit. Physical Exam    Constitutional: He is oriented to person, place, and time. He appears well-developed and well-nourished.  HENT:  Head: Normocephalic and atraumatic.  Cardiovascular: Normal rate.   Respiratory: Effort normal.  Musculoskeletal:       Right hand: He exhibits laceration.       Hands: Neurological: He is alert and oriented to person, place, and time.  Psychiatric: He has a normal mood and affect. His behavior is normal. Judgment and thought content normal.     Assessment/Plan As above  Plan explore and repair  Tanis Burnley A 08/21/2012, 3:36 PM

## 2012-08-21 NOTE — Preoperative (Signed)
Beta Blockers   Reason not to administer Beta Blockers:Not Applicable 

## 2012-08-21 NOTE — Op Note (Signed)
See note [phone]

## 2012-08-22 ENCOUNTER — Encounter (HOSPITAL_COMMUNITY): Payer: Self-pay | Admitting: Orthopedic Surgery

## 2012-08-22 MED ORDER — OXYCODONE-ACETAMINOPHEN 5-325 MG PO TABS: 1.0000 | ORAL_TABLET | ORAL | Status: DC | PRN

## 2012-08-22 NOTE — Op Note (Signed)
Warren Duran, Warren Duran             ACCOUNT NO.:  [id number]  MEDICAL RECORD NO.:  [id number]  LOCATION:  5N11C                        FACILITY:  [facility]  PHYSICIAN:  Artist Pais. Quinn Bartling, M.D.DATE OF BIRTH:  19-May-1976  DATE OF PROCEDURE:  08/21/2012 DATE OF DISCHARGE:                              OPERATIVE REPORT   PREOPERATIVE DIAGNOSIS:  Deep laceration, dorsal aspect, right hand.  POSTOPERATIVE DIAGNOSIS:  Deep laceration, dorsal aspect, right hand.  PROCEDURE:  Exploration with intra-articular fracture fixation of long metacarpal fracture with two 1.5 mm screws and extensor tendon repairs x2, long and ring finger.  SURGEON:  Artist Pais. Mina Marble, MD  ASSISTANT:  None.  ANESTHESIA:  General.  COMPLICATIONS:  No complications.  DRAINS:  No drains.  DESCRIPTION OF PROCEDURE:  The patient taken to the operating suite after induction of general anesthesia.  Right upper extremity was prepped and draped in usual sterile fashion.  An Esmarch was used to exsanguinate the limb.  Tourniquet inflated to 250 mmHg.  At this point in time, the previously sutured laceration dorsal was opened and had the sutures removed.  The flap was retracted proximally.  Dissection was carried down to the long metacarpal of the intra-articular fracture at the metacarpal head neck junction, extending to the articular surface, transverse in nature.  The fracture site was debrided of clot.  It was fixed with two 1.5 mm screws from dorsal to volar.  At this point in time, the capsule overlying the MCP joint was repaired with 3-0 undyed Vicryl and the extensor mechanism using 3-0 FiberWire x3 horizontal mattress sutures.  The same procedure with regards to the tendon repair was performed to the ring finger.  The wound was then thoroughly irrigated.  Hemostasis was achieved with bipolar cautery and loosely closed with 4-0 Vicryl Rapide.  Xeroform, 4x4s, and a volar splint was applied.  The patient tolerated  the procedure well and went to recovery room in stable fashion.     Artist Pais Mina Marble, M.D.    MAW/MEDQ  D:  08/21/2012  T:  08/22/2012  Job:  [id number]
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
## 2012-08-27 ENCOUNTER — Ambulatory Visit: Payer: Self-pay | Attending: Orthopedic Surgery | Admitting: *Deleted

## 2012-08-27 DIAGNOSIS — M25649 Stiffness of unspecified hand, not elsewhere classified: Secondary | ICD-10-CM | POA: Insufficient documentation

## 2012-08-27 DIAGNOSIS — M25549 Pain in joints of unspecified hand: Secondary | ICD-10-CM | POA: Insufficient documentation

## 2012-08-27 DIAGNOSIS — IMO0001 Reserved for inherently not codable concepts without codable children: Secondary | ICD-10-CM | POA: Insufficient documentation

## 2012-08-29 ENCOUNTER — Ambulatory Visit: Payer: Self-pay | Admitting: Occupational Therapy

## 2012-09-03 ENCOUNTER — Ambulatory Visit: Payer: Self-pay | Admitting: Occupational Therapy

## 2012-09-17 ENCOUNTER — Ambulatory Visit: Payer: Self-pay | Attending: Orthopedic Surgery | Admitting: Occupational Therapy

## 2012-09-17 DIAGNOSIS — IMO0001 Reserved for inherently not codable concepts without codable children: Secondary | ICD-10-CM | POA: Insufficient documentation

## 2012-09-17 DIAGNOSIS — M25649 Stiffness of unspecified hand, not elsewhere classified: Secondary | ICD-10-CM | POA: Insufficient documentation

## 2012-09-17 DIAGNOSIS — M25549 Pain in joints of unspecified hand: Secondary | ICD-10-CM | POA: Insufficient documentation

## 2012-09-18 ENCOUNTER — Encounter: Payer: Self-pay | Admitting: Occupational Therapy

## 2012-09-24 ENCOUNTER — Encounter: Payer: Self-pay | Admitting: Occupational Therapy

## 2012-09-26 ENCOUNTER — Ambulatory Visit: Payer: Self-pay | Admitting: Occupational Therapy

## 2012-09-30 ENCOUNTER — Encounter: Payer: Self-pay | Admitting: Occupational Therapy

## 2012-10-03 ENCOUNTER — Encounter: Payer: Self-pay | Admitting: Occupational Therapy

## 2012-12-26 ENCOUNTER — Emergency Department (HOSPITAL_COMMUNITY)
Admission: EM | Admit: 2012-12-26 | Discharge: 2012-12-27 | Disposition: A | Discharge status: Home / Self Care | Payer: Self-pay | Attending: Emergency Medicine | Admitting: Emergency Medicine

## 2012-12-26 ENCOUNTER — Encounter (HOSPITAL_COMMUNITY): Payer: Self-pay | Admitting: Emergency Medicine

## 2012-12-26 DIAGNOSIS — Z8679 Personal history of other diseases of the circulatory system: Secondary | ICD-10-CM | POA: Insufficient documentation

## 2012-12-26 DIAGNOSIS — Z23 Encounter for immunization: Secondary | ICD-10-CM | POA: Insufficient documentation

## 2012-12-26 DIAGNOSIS — F1010 Alcohol abuse, uncomplicated: Secondary | ICD-10-CM | POA: Insufficient documentation

## 2012-12-26 DIAGNOSIS — S0990XA Unspecified injury of head, initial encounter: Secondary | ICD-10-CM | POA: Insufficient documentation

## 2012-12-26 DIAGNOSIS — F17200 Nicotine dependence, unspecified, uncomplicated: Secondary | ICD-10-CM | POA: Insufficient documentation

## 2012-12-26 DIAGNOSIS — W03XXXA Other fall on same level due to collision with another person, initial encounter: Secondary | ICD-10-CM | POA: Insufficient documentation

## 2012-12-26 DIAGNOSIS — IMO0002 Reserved for concepts with insufficient information to code with codable children: Secondary | ICD-10-CM

## 2012-12-26 DIAGNOSIS — Y929 Unspecified place or not applicable: Secondary | ICD-10-CM | POA: Insufficient documentation

## 2012-12-26 DIAGNOSIS — Y939 Activity, unspecified: Secondary | ICD-10-CM | POA: Insufficient documentation

## 2012-12-26 DIAGNOSIS — S0180XA Unspecified open wound of other part of head, initial encounter: Secondary | ICD-10-CM | POA: Insufficient documentation

## 2012-12-26 NOTE — ED Notes (Signed)
Upon exam pt has a large deep laceration over his left eyebrow, abrasions noted to left arm and hand  Unknown LOC  Pt is homeless
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
## 2012-12-27 ENCOUNTER — Emergency Department (HOSPITAL_COMMUNITY): Payer: Self-pay

## 2012-12-27 ENCOUNTER — Encounter (HOSPITAL_COMMUNITY): Payer: Self-pay | Admitting: Radiology

## 2012-12-27 MED ORDER — TETANUS-DIPHTH-ACELL PERTUSSIS 5-2.5-18.5 LF-MCG/0.5 IM SUSP
0.5000 mL | Freq: Once | INTRAMUSCULAR | Status: AC
  Administered 2012-12-27: 0.5 mL via INTRAMUSCULAR
  Filled 2012-12-27: qty 0.5

## 2012-12-27 MED ORDER — CEPHALEXIN 500 MG PO CAPS: 500.0000 mg | ORAL_CAPSULE | Freq: Four times a day (QID) | ORAL | Status: DC
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
## 2012-12-27 NOTE — ED Provider Notes (Signed)
Medical screening examination/treatment/procedure(s) were performed by non-physician practitioner and as supervising physician I was immediately available for consultation/collaboration.  Mercy Malena, MD 12/27/12 0631 

## 2012-12-27 NOTE — ED Notes (Signed)
Pt. Ambulated down the hall and back to his room without difficulty. Pt. Steady gait on his feet.

## 2013-06-23 ENCOUNTER — Emergency Department (HOSPITAL_COMMUNITY)
Admission: EM | Admit: 2013-06-23 | Discharge: 2013-06-24 | Disposition: A | Discharge status: Home / Self Care | Payer: Self-pay | Attending: Emergency Medicine | Admitting: Emergency Medicine

## 2013-06-23 ENCOUNTER — Encounter (HOSPITAL_COMMUNITY): Payer: Self-pay | Admitting: Family Medicine

## 2013-06-23 DIAGNOSIS — Z792 Long term (current) use of antibiotics: Secondary | ICD-10-CM | POA: Insufficient documentation

## 2013-06-23 DIAGNOSIS — Z79899 Other long term (current) drug therapy: Secondary | ICD-10-CM | POA: Insufficient documentation

## 2013-06-23 DIAGNOSIS — H6590 Unspecified nonsuppurative otitis media, unspecified ear: Secondary | ICD-10-CM | POA: Insufficient documentation

## 2013-06-23 DIAGNOSIS — H9319 Tinnitus, unspecified ear: Secondary | ICD-10-CM | POA: Insufficient documentation

## 2013-06-23 DIAGNOSIS — F17200 Nicotine dependence, unspecified, uncomplicated: Secondary | ICD-10-CM | POA: Insufficient documentation

## 2013-06-23 NOTE — ED Notes (Signed)
Patient states that he has had an earache for the past 3 weeks.

## 2013-06-24 MED ORDER — OFLOXACIN 0.3 % OP SOLN
5.0000 [drp] | Freq: Two times a day (BID) | OPHTHALMIC | Status: DC
  Administered 2013-06-24: 5 [drp] via OTIC
  Filled 2013-06-24: qty 5

## 2013-06-24 MED ORDER — OFLOXACIN 0.3 % OT SOLN
5.0000 [drp] | Freq: Two times a day (BID) | OTIC | Status: DC
  Filled 2013-06-24: qty 5

## 2013-06-24 MED ORDER — OFLOXACIN 0.3 % OT SOLN
5.0000 [drp] | Freq: Two times a day (BID) | OTIC | Status: DC
Start: 2013-06-24 — End: 2013-06-24

## 2013-06-24 MED ORDER — AMOXICILLIN 500 MG PO CAPS: 500.0000 mg | ORAL_CAPSULE | Freq: Three times a day (TID) | ORAL | Status: DC
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
## 2013-06-24 NOTE — ED Provider Notes (Signed)
Medical screening examination/treatment/procedure(s) were performed by non-physician practitioner and as supervising physician I was immediately available for consultation/collaboration.  Tayton Decaire R. Ofelia Podolski, MD 06/24/13 0711 

## 2013-07-04 ENCOUNTER — Emergency Department (HOSPITAL_COMMUNITY)
Admission: EM | Admit: 2013-07-04 | Discharge: 2013-07-04 | Disposition: A | Discharge status: Home / Self Care | Payer: Self-pay | Attending: Emergency Medicine | Admitting: Emergency Medicine

## 2013-07-04 ENCOUNTER — Encounter (HOSPITAL_COMMUNITY): Payer: Self-pay | Admitting: Emergency Medicine

## 2013-07-04 DIAGNOSIS — Y929 Unspecified place or not applicable: Secondary | ICD-10-CM | POA: Insufficient documentation

## 2013-07-04 DIAGNOSIS — W503XXA Accidental bite by another person, initial encounter: Secondary | ICD-10-CM

## 2013-07-04 DIAGNOSIS — F17200 Nicotine dependence, unspecified, uncomplicated: Secondary | ICD-10-CM | POA: Insufficient documentation

## 2013-07-04 DIAGNOSIS — Y9389 Activity, other specified: Secondary | ICD-10-CM | POA: Insufficient documentation

## 2013-07-04 DIAGNOSIS — X58XXXA Exposure to other specified factors, initial encounter: Secondary | ICD-10-CM | POA: Insufficient documentation

## 2013-07-04 DIAGNOSIS — S51809A Unspecified open wound of unspecified forearm, initial encounter: Secondary | ICD-10-CM | POA: Insufficient documentation

## 2013-07-04 MED ORDER — CEPHALEXIN 500 MG PO CAPS: 500.0000 mg | ORAL_CAPSULE | Freq: Four times a day (QID) | ORAL | Status: DC

## 2013-07-04 MED ORDER — AMOXICILLIN-POT CLAVULANATE 875-125 MG PO TABS
1.0000 | ORAL_TABLET | Freq: Once | ORAL | Status: AC
  Administered 2013-07-04: 1 via ORAL
  Filled 2013-07-04: qty 1

## 2013-07-04 MED ORDER — NAPROXEN 500 MG PO TABS
500.0000 mg | ORAL_TABLET | Freq: Once | ORAL | Status: AC
  Administered 2013-07-04: 500 mg via ORAL
  Filled 2013-07-04: qty 1

## 2013-07-04 MED ORDER — BACITRACIN ZINC 500 UNIT/GM EX OINT
1.0000 "application " | TOPICAL_OINTMENT | Freq: Once | CUTANEOUS | Status: AC
  Administered 2013-07-04: 1 via TOPICAL
  Filled 2013-07-04: qty 0.9
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
## 2013-10-08 ENCOUNTER — Encounter (HOSPITAL_COMMUNITY): Payer: Self-pay | Admitting: Emergency Medicine

## 2013-10-08 ENCOUNTER — Emergency Department (HOSPITAL_COMMUNITY)
Admission: EM | Admit: 2013-10-08 | Discharge: 2013-10-09 | Disposition: A | Discharge status: Home / Self Care | Payer: Self-pay | Attending: Emergency Medicine | Admitting: Emergency Medicine

## 2013-10-08 DIAGNOSIS — F10929 Alcohol use, unspecified with intoxication, unspecified: Secondary | ICD-10-CM

## 2013-10-08 DIAGNOSIS — F1010 Alcohol abuse, uncomplicated: Secondary | ICD-10-CM | POA: Insufficient documentation

## 2013-10-08 DIAGNOSIS — F17200 Nicotine dependence, unspecified, uncomplicated: Secondary | ICD-10-CM | POA: Insufficient documentation

## 2013-10-08 LAB — BASIC METABOLIC PANEL
CO2: 25 mEq/L (ref 19–32)
Chloride: 103 mEq/L (ref 96–112)
Creatinine, Ser: 0.7 mg/dL (ref 0.50–1.35)
Glucose, Bld: 96 mg/dL (ref 70–99)

## 2013-10-08 LAB — CBC
HCT: 39.2 % (ref 39.0–52.0)
Hemoglobin: 13.7 g/dL (ref 13.0–17.0)
MCV: 96.3 fL (ref 78.0–100.0)
Platelets: 234 10*3/uL (ref 150–400)
RBC: 4.07 MIL/uL — ABNORMAL LOW (ref 4.22–5.81)
WBC: 8.1 10*3/uL (ref 4.0–10.5)

## 2013-10-08 LAB — ETHANOL: Alcohol, Ethyl (B): 378 mg/dL — ABNORMAL HIGH (ref 0–11)
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
## 2013-10-08 NOTE — ED Notes (Signed)
Pt has been evaluated by an EP provider. Pt on monitor, stable at the monitor, airway intact.
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
## 2013-10-08 NOTE — ED Provider Notes (Signed)
CSN: [id number]     Arrival date & time 10/08/13  2014 History   First MD Initiated Contact with Patient 10/08/13 2104     Chief Complaint  Patient presents with  . Alcohol Intoxication   (Consider location/radiation/quality/duration/timing/severity/associated sxs/prior Treatment) Patient is a 37 y.o. male presenting with intoxication.  Alcohol Intoxication   37 yo male found asleep outside of a gas station. Arrived to ED via EMS. Patient is incontinent of urine and appears intoxicated. Patient is responsive to noxious stimuli. Airway appears intact. Patient non-verbal, Level 5 caveat secondary to intoxication.  Past Medical History  Diagnosis Date  . Complication of anesthesia     patient may fight back with anesthesia  . Headache(784.0)     migraines last one 1 yr ago   Past Surgical History  Procedure Laterality Date  . Tooth extraction  >2 yrs ago for last one     10 years ago for original surgery  . Hand tendon surgery      left hand  . I&d extremity  08/21/2012    Procedure: IRRIGATION AND DEBRIDEMENT EXTREMITY;  Surgeon: Marlowe Shores, MD;  Location: [facility] OR;  Service: Orthopedics;  Laterality: Right;  EXPLORATION/REPAIR DORSAL LACERATION OF RIGHT HAND, open reduction internal fixation right metacarpel.   Family History  Problem Relation Age of Onset  . Cancer - Lung Mother   . Other Other   . Heart disease Other   . Cancer - Lung Other   . Other Sister    History  Substance Use Topics  . Smoking status: Current Every Day Smoker -- 1.00 packs/day for 24 years    Types: Cigarettes  . Smokeless tobacco: Not on file     Comment: declines  . Alcohol Use: Yes     Comment: Occasionally    Review of Systems  Unable to perform ROS: Patient nonverbal    Allergies  Review of patient's allergies indicates no known allergies.  Home Medications  No current outpatient prescriptions on file. BP 98/68  Pulse 117  Temp(Src) 97.8 F (36.6 C) (Oral)  Resp 19  SpO2  97% Physical Exam  Nursing note and vitals reviewed. Constitutional: He appears well-developed and well-nourished. No distress.  HENT:  Head: Normocephalic and atraumatic.  Eyes: Conjunctivae are normal. Pupils are equal, round, and reactive to light. No scleral icterus.  Cardiovascular: Normal rate and regular rhythm.  Exam reveals no gallop and no friction rub.   No murmur heard. Pulmonary/Chest: Effort normal and breath sounds normal. No respiratory distress.  Musculoskeletal: Normal range of motion. He exhibits no edema.  Neurological: He is alert. He has normal strength.  Skin: Skin is warm and dry. No rash noted. He is not diaphoretic.    ED Course  Procedures (including critical care time) Labs Review Labs Reviewed  CBC - Abnormal; Notable for the following:    RBC 4.07 (*)    All other components within normal limits  BASIC METABOLIC PANEL - Abnormal; Notable for the following:    Potassium 3.4 (*)    All other components within normal limits  ETHANOL - Abnormal; Notable for the following:    Alcohol, Ethyl (B) 378 (*)    All other components within normal limits  GLUCOSE, CAPILLARY   Imaging Review No results found.  EKG Interpretation   None       MDM   1. Intoxication    Patient appears to be intoxicated. Patient does not verbally communicate. Patient airway intact without. Level 5  caveat secondary to intoxication. Patient placed on cardiac monitor and pulse ox. Will get CBG. Plan to monitor and discharge patient after he sleeps off intoxication.    Patient signed over to Dr. Dione Booze.         Rudene Anda, PA-C 10/09/13 [phone]

## 2013-10-08 NOTE — ED Provider Notes (Signed)
  Face-to-face evaluation   History: Patient found outside intoxicated  Physical exam: Temperature normal in emergency department. He is attempted, but responds to noxious stimuli  Medical screening examination/treatment/procedure(s) were conducted as a shared visit with non-physician practitioner(s) and myself.  I personally evaluated the patient during the encounter  Flint Melter, MD 10/09/13 1755

## 2013-10-09 NOTE — Discharge Instructions (Signed)
Alcohol Intoxication Alcohol intoxication occurs when the amount of alcohol that a person has consumed impairs his or her ability to mentally and physically function. Alcohol directly impairs the normal chemical activity of the brain. Drinking large amounts of alcohol can lead to changes in mental function and behavior, and it can cause many physical effects that can be harmful.  Alcohol intoxication can range in severity from mild to very severe. Various factors can affect the level of intoxication that occurs, such as the person's age, gender, weight, frequency of alcohol consumption, and the presence of other medical conditions (such as diabetes, seizures, or heart conditions). Dangerous levels of alcohol intoxication may occur when people drink large amounts of alcohol in a short period (binge drinking). Alcohol can also be especially dangerous when combined with certain prescription medicines or "recreational" drugs. SIGNS AND SYMPTOMS Some common signs and symptoms of mild alcohol intoxication include:  Loss of coordination.  Changes in mood and behavior.  Impaired judgment.  Slurred speech. As alcohol intoxication progresses to more severe levels, other signs and symptoms will appear. These may include:  Vomiting.  Confusion and impaired memory.  Slowed breathing.  Seizures.  Loss of consciousness. DIAGNOSIS  Your health care provider will take a medical history and perform a physical exam. You will be asked about the amount and type of alcohol you have consumed. Blood tests will be done to measure the concentration of alcohol in your blood. In many places, your blood alcohol level must be lower than 80 mg/dL (0.08%) to legally drive. However, many dangerous effects of alcohol can occur at much lower levels.  TREATMENT  People with alcohol intoxication often do not require treatment. Most of the effects of alcohol intoxication are temporary, and they go away as the alcohol naturally  leaves the body. Your health care provider will monitor your condition until you are stable enough to go home. Fluids are sometimes given through an IV access tube to help prevent dehydration.  HOME CARE INSTRUCTIONS  Do not drive after drinking alcohol.  Stay hydrated. Drink enough water and fluids to keep your urine clear or pale yellow. Avoid caffeine.   Only take over-the-counter or prescription medicines as directed by your health care provider.  SEEK MEDICAL CARE IF:   You have persistent vomiting.   You do not feel better after a few days.  You have frequent alcohol intoxication. Your health care provider can help determine if you should see a substance use treatment counselor. SEEK IMMEDIATE MEDICAL CARE IF:   You become shaky or tremble when you try to stop drinking.   You shake uncontrollably (seizure).   You throw up (vomit) blood. This may be bright red or may look like black coffee grounds.   You have blood in your stool. This may be bright red or may appear as a black, tarry, bad smelling stool.   You become lightheaded or faint.  MAKE SURE YOU:   Understand these instructions.  Will watch your condition.  Will get help right away if you are not doing well or get worse. Document Released: 07/05/2005 Document Revised: 05/28/2013 Document Reviewed: 02/28/2013 [facility] Patient Information 2014 [place].   Emergency Department Resource Guide 1) Find a Doctor and Pay Out of Pocket Although you won't have to find out who is covered by your insurance plan, it is a good idea to ask around and get recommendations. You will then need to call the office and see if the doctor you have chosen  will accept you as a new patient and what types of options they offer for patients who are self-pay. Some doctors offer discounts or will set up payment plans for their patients who do not have insurance, but you will need to ask so you aren't surprised when you get to your  appointment.  2) Contact Your Local Health Department Not all health departments have doctors that can see patients for sick visits, but many do, so it is worth a call to see if yours does. If you don't know where your local health department is, you can check in your phone book. The CDC also has a tool to help you locate your state's health department, and many state websites also have listings of all of their local health departments.  3) Find a [facility] If your illness is not likely to be very severe or complicated, you may want to try a walk in clinic. These are popping up all over the country in pharmacies, drugstores, and shopping centers. They're usually staffed by nurse practitioners or physician assistants that have been trained to treat common illnesses and complaints. They're usually fairly quick and inexpensive. However, if you have serious medical issues or chronic medical problems, these are probably not your best option.  No Primary Care Doctor: - Call Health Connect at  [phone] - they can help you locate a primary care doctor that  accepts your insurance, provides certain services, etc. - Physician Referral Service- [phone]  Chronic Pain Problems: Organization         Address  Phone   Notes  [facility]  [phone] Patients need to be referred by their primary care doctor.   Medication Assistance: Organization         Address  Phone   Notes  [facility] Medication [facility] [address] [place], [place] [address] [phone] --Must be a resident of [facility] -- Must have NO insurance coverage whatsoever (no Medicaid/ Medicare, etc.) -- The pt. MUST have a primary care doctor that directs their care regularly and follows them in the community   [facility]ist  [phone]   [employer]  [phone]    Agencies that provide inexpensive medical care: Organization         Address  Phone   Notes  [place]  [phone]   [facility]    [phone]   [facility] [place],  [address] [phone]   [place] [address], [place] [phone]   Planned Parenthood    [phone]   [facility]    [phone]   [place] and [place] [address], [place] Phone:  [phone], Fax:  [phone] Hours of Operation:  9 am - 6 pm, M-F.  Also accepts Medicaid/Medicare and self-pay.  [facility] for [place], [address], [place] Phone: [phone], Fax: [phone]. Hours of Operation:  8:30 am - 5:30 pm, M-F.  Also accepts Medicaid and self-pay.  [facility] [place] [address], [place] Phone: [phone]   [place], [place], [place] [phone], Ext. [phone] Mondays & Thursdays: 7-9 AM.  First 15 patients are seen on a first come, first serve basis.    [place] Providers:  Organization  Address  Phone   Notes  [facility] [address], [place] [phone] Also accepts self-pay patients.  [facility] [id number] [place], [place]  [phone]   [place], [address], [place] [phone]   [facility] [address], [place] [phone]   Lucianne Lei [address], [place]   [phone] Only accepts [place] Access [place] patients after they have their name applied to their card.   Self-Pay (no insurance) in [facility]:  Organization         Address  Phone   Notes  Sickle Cell Patients, [facility] [place] [phone]   [facility] [place] [phone]   [facility]  [place]  [place], [place], [place] [phone]   [facility]/Dr. Osei-Bonsu  [address] [place] or [address], [place] [phone] Phone number for both [place] and [place] locations is the same.  [facility] [address], [place] [phone]   [facility] [address], [place] or [address] [phone]   [facility] [address] [place] [phone], phone; [phone], fax Sees patients 1st and 3rd Saturday of every month.  Must not qualify for public or private insurance (i.e. Medicaid, Medicare, [place] Health Choice, Veterans' Benefits)  Household income should be no more than 200% of the poverty level The clinic cannot treat you if you are pregnant or think you are pregnant  Sexually transmitted diseases are not treated at the clinic.    Dental Care: Organization         Address  Phone  Notes  [facility] [place] [phone] Accepts children up to age 49 who are enrolled in [place] or [place]; pregnant women with a Medicaid card; and children who have applied for Medicaid or [place] Health Choice, but were declined, whose parents can pay a reduced fee at time of service.  [facility] Department of [facility]  [address], [place] [phone] Accepts children up to age 71 who are enrolled in [place] or [place]; pregnant women with a Medicaid card; and children who have applied for Medicaid or [place] Health Choice, but were declined, whose parents can pay a reduced fee at time of service.  [place] [facility]  [place] [phone] Patients are seen by appointment only. Walk-ins are not accepted. [place] will see patients 31 years of age and  older. Monday - Tuesday (8am-5pm) Most Wednesdays (8:30-5pm) $30 per visit, cash only  [facility]  [address], [facility] [phone] Patients are seen by appointment only. Walk-ins are not accepted. [place] will see patients 8 years of age and older. One Wednesday Evening (Monthly: Volunteer Based).  $30 per visit, cash only  [place]  [phone] for adults; Children under age 39, call Graduate Pediatric Dentistry at [phone]. Children aged 82-14, please call [phone] to request a pediatric application.  Dental services are provided in all areas of dental care including  fillings, crowns and bridges, complete and partial dentures, implants, gum treatment, root canals, and extractions. Preventive care is also provided. Treatment is provided to both adults and children. Patients are selected via a lottery and there is often a waiting list.   [facility] [address] [place]  [phone] [email or web address]   [facility] [address] [place], [place] [phone], Ext. [phone] Second and Fourth Thursday of each month, opens at 6:30 AM; Clinic ends at 9 AM.  Patients are seen on a first-come first-served basis, and a limited number are seen during each clinic.   [facility]  [address] [place] [place], [place] [phone]   Eligibility Requirements You must have lived in [place], [place], or [place] counties for at least the last three months.   You cannot be eligible for state or federal sponsored [employer], including [employer], [place], or [employer].   You generally cannot be eligible for healthcare insurance through your employer.    How to apply: Eligibility screenings are held every Tuesday and Wednesday afternoon from 1:00 pm until 4:00 pm. You do not need an appointment for the interview!  [facility] [address]  [place], [place]   [place]  [place] Department  [place]  [phone]    Behavioral Health Resources in the Community: Intensive Outpatient Programs Organization         Address  Phone  Notes  [place]. [address] [place], [place] [phone]   [facility] [address] [place], [place]   [facility]: [facility] [address] [place], [place]   [place] [address]  [place], [place] or [phone]   Substance Abuse Resources Organization         Address  Phone  Notes  Alcohol and Drug Services  [phone]   [place]  [phone]   [facility] [place]   Chinita Pester  [phone]   Residential & Outpatient Substance Abuse Program  [phone]   Psychological Services Organization         Address  Phone  Notes  [facility] [place]  [place]  [phone]   [place] [address] [place] or [phone]    Mobile Crisis Teams Organization         Address  Phone  Notes  [facility], Mobile Crisis Care Unit  [phone]   [facility]  [address] [place], [place]   [place] [address] [place] [phone]    Self-Help/Support Groups Organization         Address  Phone             Notes  [place]. of [place] - variety of support groups  [facility] [place] Call for more information  Narcotics Anonymous (NA), [facility] [address], [employer] [place]  2 meetings at this location   Special educational needs teacher         Address  Phone  Notes  [facility] [place],    [place]  [place]  [address], [place] [phone], [place], [place]   [place],  [place] [phone] Admissions: 8am-3pm M-F  °[facility] [address]    °[place], [place] [phone]   °[facility] [address], [place], [place] [phone]   °[facility] [address]  °[place], [place] [phone]   °[facility] - Intensive Outpatient [address], [place], [place] [phone]   °[facility] [address]  °[place], [place] [phone] or [phone]   °[facility] ([facility]) [address] [place], [place] [phone] Accepts Medicaid  °[facility] [address]  °[place] 1-800-659-3381 Substance Abuse/Addiction Treatment  ° °[place] Behavioral Health Resources °Organization         Address  Phone  Notes  °[facility]  ([phone]) [phone]   °Julie Brannon, PhD [address] [place], [place]   ([phone]) [phone] or (336) 951-0000   °[facility]   [address] °[place], [place] ([phone]) [phone]   °[facility] [address], [place], [place] ([phone]) [phone] Insurance/Medicaid/sponsorship through [facility]  °[facility] [address]                                    [place], [place] [phone] Therapy/tele-psych/case  °[facility] [address]  ° [place], [place] ([phone]) [phone]    °Dr. Arfeen  ([phone]) [phone]   °[facility] [place]  [employer] [place] [facility] 1) [address], [place] °2) [address], [place] °3)  [address], [place] ([phone]) [phone] °([phone]) [phone] ° °([phone]) [phone]   °[place] Child Abuse Hotline (336) 342-1394 or (336) [phone] (After Hours)    ° ° ° °

## 2013-10-09 NOTE — ED Notes (Signed)
Pt now fully awake, able to communicate effectively with staff. VSS

## 2013-10-09 NOTE — ED Notes (Signed)
Pt ambulated well 

## 2013-10-09 NOTE — ED Provider Notes (Signed)
The patient was initially seen and evaluated by Cristobal GoldmannJacob Lackey PA-C and diagnosed with alcohol intoxication. Alcohol level was over 300. He was observed in the emergency department overnight and has been sleeping comfortably. In the morning, he he was able to ambulate without difficulty and will be discharged.  Dione Boozeavid Norie Latendresse, MD 10/09/13 [phone]

## 2013-10-09 NOTE — ED Notes (Signed)
Bed: WA03 Expected date:  Expected time:  Means of arrival:  Comments: 
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
## 2014-09-17 ENCOUNTER — Emergency Department (HOSPITAL_COMMUNITY)
Admission: EM | Admit: 2014-09-17 | Discharge: 2014-09-18 | Disposition: A | Discharge status: Home / Self Care | Payer: No Typology Code available for payment source | Attending: Emergency Medicine | Admitting: Emergency Medicine

## 2014-09-17 ENCOUNTER — Encounter (HOSPITAL_COMMUNITY): Payer: Self-pay | Admitting: *Deleted

## 2014-09-17 DIAGNOSIS — Z720 Tobacco use: Secondary | ICD-10-CM | POA: Insufficient documentation

## 2014-09-17 DIAGNOSIS — F10929 Alcohol use, unspecified with intoxication, unspecified: Secondary | ICD-10-CM

## 2014-09-17 DIAGNOSIS — Z8679 Personal history of other diseases of the circulatory system: Secondary | ICD-10-CM | POA: Insufficient documentation

## 2014-09-17 DIAGNOSIS — F10129 Alcohol abuse with intoxication, unspecified: Secondary | ICD-10-CM | POA: Diagnosis present

## 2014-09-17 NOTE — ED Notes (Signed)
Per EMS they were called [place] [employer] d/t pt not being able to walk into drunk tank, pt was found on campus passed out, pt is sleepy and intoxicated, unknown of how much he drank.

## 2014-09-17 NOTE — ED Notes (Signed)
Bed: [id number] Expected date:  Expected time:  Means of arrival:  Comments: EMS 38yo M ETOH - declined by drunk tank

## 2014-09-17 NOTE — ED Notes (Signed)
Pt sleeping in bed, when name called pt answers, pt smells of alcohol, pt unable to state what he has drank.

## 2014-09-18 NOTE — Discharge Instructions (Signed)
Drunk  You drank to the point of becoming unconscious.  This is very dangerous, as you could easily die from choking on your own vomit or alcohol toxicity.  You will need to be very careful in the future to not drink to excess.  Expect to have a headache today, and to have nausea and vomiting.  This is good, to continue to purge the alcohol from your system.  Stick to a bland diet, drink plenty of water, but go slow.   ALCOHOL INTOXICATION  ALCOHOL INTOXICATION: You have been seen for intoxication with alcohol.  The use of alcohol in a manner such that you ended up here today strongly suggests that you may have a problem with alcohol abuse.  The abuse of alcohol can cause many chronic problems including liver disease, stomach ulcers and pancreatitis.  If alcohol is consumed in very large amounts, it can cause you to stop breathing and die.  Fortunately, you did not suffer any life-threatening complications with this episode.  DO NOT DRIVE A VEHICLE UNDER THE INFLUENCE OF ALCOHOL! YOU MAY INJURE OR KILL YOURSELF OR SOMEONE ELSE IF YOU DRINK AND DRIVE.  YOU SHOULD SEEK MEDICAL ATTENTION IMMEDIATELY, EITHER HERE OR AT THE NEAREST EMERGENCY DEPARTMENT, IF ANY OF THE FOLLOWING OCCURS:    You have any other episodes of alcohol intoxication in which you drink enough to have a loss of consciousness ("black outs").   You develop any confusion, lethargy or altered thinking, even while not being intoxicated.   Behavioral Health Resources [facility]   [phone]   [facility]   [phone]  [facility]    Mobile Crisis                         [phone]   [facility]            [place]                            [phone]  [place]                          [phone]  24 hours                              [phone]   [facility]                [phone] 24hr                                     [phone]   [place]                              [phone]   [place]                             [phone]   [facility] [place] [facility] 24 hr Access line                   [phone] [facility]      [phone]   [facility] 24hr Murphy Oilccess line  [phone]   [facility]                                 [phone]   [phone]                           [phone]    *SUBSTANCE ABUSE REFERRAL       SUBSTANCE ABUSE REFERRALS       IN-PATIENT DETOX  Name Address Phone Number  [facility] [address] [place], [place] [phone]  [facility] [address], [place] [phone]  [facility] [address], [place] [phone]  [facility] [place], [place] [phone]  [facility] [place] [address] [place], [place] [phone]        LONG-TERM TREATMENT  [facility] [address] [facility] [phone]  [facility] [address] [place] [phone]  [employer] [address] [place], [place] [phone]  [facility] [address] [place] [phone]  [facility] [place], [place] [phone]  [facility] [address] [place], [place] [phone]  [facility] [address] [place], [place] [phone]  [facility] [address] [place], [place] [phone]  [facility]  [address], [place], [place] [phone]  [facility] [place] [address] [place], [place] [phone]  [facility] [place] [address]. [place], [place] [phone]      OUT-PATIENT ALCOHOL AND DRUG TREATMENT  [facility]  [address] [place], [place] [phone]  [facility] [address] [place], [place] [phone]  [facility]   [address] [place], [place] [phone]  [facility] [address] [place], [place]  [phone]   [place] [phone] [place] [phone]   [place] [phone] [place] [phone]  [facility]  [address] [place] [phone]  [facility]  [address] [place] [phone]  [address], [place] [phone]  [facility] [place], [place] [phone]  [facility] (Ages 13-25) [phone] [place] [address] [place] [phone]  [employer] [employer] (adolescent) [address]  [phone]  Alcoholics Anonymous [place] LimitShare.[email or web address].com [phone] or       [phone]  Narcotics Anonymous www.na.Gerre Scullorg [phone]  [facility] [address] [place], [place] [phone]     *RESOURCE GUIDE  RESOURCE GUIDE  Dental Problems  Patients with Medicaid: [facility]                                            [facility] [phone]                                                                   [phone] Phone:  [phone]  Phone:  [phone]  If unable to pay or uninsured, contact:  [facility] or [facility] to become qualified for the adult dental clinic.  Chronic Pain Problems Contact [facility]  [phone] Patients need to be referred by their primary care doctor.  Insufficient Money for Medicine Contact United Way:  call "211" or [facility] [phone].  No Primary Care Doctor Call [facility]  [phone] Other agencies that provide inexpensive medical care    [facility]  [phone]    [facility]  [phone]    [facility]  [phone]    [facility]  [phone]    [facility]  [phone]    [facility]  [phone]  [facility] Services Specialists [facility]  [phone] [facility]   [phone] [facility]   [phone] (emergency services [phone])  Abuse/Neglect [facility] Child Abuse Hotline (754)[phone](336) 9391122667 [facility] Child Abuse Hotline ([phone] (After Hours)  Emergency Shelter [facility] [phone]) [phone]  Maternity Homes Room at the [place] [facility] [place] [phone](336) [phone] [facility]beca AlertFl[facility] Services [phone]  MRSA Hotline #:   (909) 548-2048(562) 393-8666  [facility] Resources  [facility] [place] [place]     United Way                          [facility] [address] [place]                        [address]          [address] [place] [address]  [place]                                                [place]                            [place] Phone:  [phone]                                     Phone:  [phone]                   Phone:  [phone]  [facility] Phone:  [phone]  [facility] Child Abuse Hotline (321)111-8317(336) 802-077-5297 [phone](336) 3524613506 (After Hours)      If you develop symptoms of Shortness of Breath, Chest Pain, Swelling of lips, mouth or tongue or if your condition becomes worse with any new symptoms, see your doctor or return to the Emergency Department for immediate care. Emergency services are not intended to be a substitute for comprehensive medical attention.  Please contact your doctor for follow up if not improving as expected.   Call your doctor in 5-7 days or as directed if there is no improvement.   Community Resources: *IF YOU ARE IN IMMEDIATE DANGER CALL 911!  Abuse/Neglect:  Family Services Crisis Hotline [facility]): 704-031-5626(336) 5407784157 Center Against Violence [facility]([place]): 867-656-3110(336) [phone]  After hours, holidays and weekends: [phone](336) ([phone] National Domestic Violence Hotline: 337-867-4753(908)626-1619  Mental Health: [facility]: [address]: [phone]) [phone]  Health Clinics:  Urgent  Care Center [facility]): [phone] Monday - Friday 8 AM - 9 PM, Saturday and Sunday 10 AM - 9 PM  [facility] [place]: [phone] Monday - Friday 8 AM - 5 PM  [facility]  [address]: [phone] Monday- Friday 8:30 AM - 5:30 PM, Sat 9 AM - 1 PM  24 HR [facility] [facility] on [address]: [phone] [facility] on [address]: [phone] [facility] on [address]: [phone]  24 HR [facility] [facility]: [address] [phone]  Cultures: If culture results are positive, we will notify you if a change in treatment is necessary.  LABORATORY TESTS:         If you had any labs drawn in the ED that have not resulted by the time you are discharged home, we will review these lab results and the treatment given to you.  If there is any further treatment or notification needed, we will contact you by phone, or letter.  "PLEASE ENSURE THAT YOU HAVE GIVEN US YOUR CURRENT WORKING PHONE NUMBER AND YOUR CURRENT ADDRESS, so that we can contact you if needed."  RADIOLOGY TESTS:  If the referred physician wants todays x-rays, please call the hospitals Radiology Department the day before your doctors appointment. [place]     [phone] [place]   [phone] [place]     [phone]  Our doctors and staff appreciate your choosing [place] for your emergency medical care needs. We are here to serve you.   Alcohol Use Disorder Alcohol use disorder is a mental disorder. It is not a one-time incident of heavy drinking. Alcohol use disorder is the excessive and uncontrollable use of alcohol over time that leads to problems with functioning in one or more areas of daily living. People with this disorder risk harming themselves and others when they drink to excess. Alcohol use disorder also can cause other mental disorders, such as mood and anxiety disorders, and serious physical problems. People with alcohol use disorder often misuse other drugs.  Alcohol use disorder is  common and widespread. Some people with this disorder drink alcohol to cope with or escape from negative life events. Others drink to relieve chronic pain or symptoms of mental illness. People with a family history of alcohol use disorder are at higher risk of losing control and using alcohol to excess.  SYMPTOMS  Signs and symptoms of alcohol use disorder may include the following:   Consumption ofalcohol inlarger amounts or over a longer period of time than intended.  Multiple unsuccessful attempts to cutdown or control alcohol use.   A great deal of time spent obtaining alcohol, using alcohol, or recovering from the effects of alcohol (hangover).  A strong desire or urge to use alcohol (cravings).   Continued use of alcohol despite problems at work, school, or home because of alcohol use.   Continued use of alcohol despite problems in relationships because of alcohol use.  Continued use of alcohol in situations when it is physically hazardous, such as driving a car.  Continued use of alcohol despite awareness of a physical or psychological problem that is likely related to alcohol use. Physical problems related to alcohol use can involve the brain, heart, liver, stomach, and intestines. Psychological problems related to alcohol use include intoxication, depression, anxiety, psychosis, delirium, and dementia.   The need for increased amounts of alcohol to achieve the same desired effect, or a decreased effect from the consumption of the  same amount of alcohol (tolerance).  Withdrawal symptoms upon reducing or stopping alcohol use, or alcohol use to reduce or avoid withdrawal symptoms. Withdrawal symptoms include:  Racing heart.  Hand tremor.  Difficulty sleeping.  Nausea.  Vomiting.  Hallucinations.  Restlessness.  Seizures. DIAGNOSIS Alcohol use disorder is diagnosed through an assessment by your health care provider. Your health care provider may start by asking  three or four questions to screen for excessive or problematic alcohol use. To confirm a diagnosis of alcohol use disorder, at least two symptoms must be present within a 60-month period. The severity of alcohol use disorder depends on the number of symptoms:  Mild--two or three.  Moderate--four or five.  Severe--six or more. Your health care provider may perform a physical exam or use results from lab tests to see if you have physical problems resulting from alcohol use. Your health care provider may refer you to a mental health professional for evaluation. TREATMENT  Some people with alcohol use disorder are able to reduce their alcohol use to low-risk levels. Some people with alcohol use disorder need to quit drinking alcohol. When necessary, mental health professionals with specialized training in substance use treatment can help. Your health care provider can help you decide how severe your alcohol use disorder is and what type of treatment you need. The following forms of treatment are available:   Detoxification. Detoxification involves the use of prescription medicines to prevent alcohol withdrawal symptoms in the first week after quitting. This is important for people with a history of symptoms of withdrawal and for heavy drinkers who are likely to have withdrawal symptoms. Alcohol withdrawal can be dangerous and, in severe cases, cause death. Detoxification is usually provided in a hospital or in-patient substance use treatment facility.  Counseling or talk therapy. Talk therapy is provided by substance use treatment counselors. It addresses the reasons people use alcohol and ways to keep them from drinking again. The goals of talk therapy are to help people with alcohol use disorder find healthy activities and ways to cope with life stress, to identify and avoid triggers for alcohol use, and to handle cravings, which can cause relapse.  Medicines.Different medicines can help treat alcohol  use disorder through the following actions:  Decrease alcohol cravings.  Decrease the positive reward response felt from alcohol use.  Produce an uncomfortable physical reaction when alcohol is used (aversion therapy).  Support groups. Support groups are run by people who have quit drinking. They provide emotional support, advice, and guidance. These forms of treatment are often combined. Some people with alcohol use disorder benefit from intensive combination treatment provided by specialized substance use treatment centers. Both inpatient and outpatient treatment programs are available. Document Released: 11/02/2004 Document Revised: 02/09/2014 Document Reviewed: 01/02/2013 [facility] Patient Information 2015 [place], [place]. This information is not intended to replace advice given to you by your health care provider. Make sure you discuss any questions you have with your health care provider.  Alcohol Intoxication Alcohol intoxication occurs when the amount of alcohol that a person has consumed impairs his or her ability to mentally and physically function. Alcohol directly impairs the normal chemical activity of the brain. Drinking large amounts of alcohol can lead to changes in mental function and behavior, and it can cause many physical effects that can be harmful.  Alcohol intoxication can range in severity from mild to very severe. Various factors can affect the level of intoxication that occurs, such as the person's age, gender, weight, frequency of  alcohol consumption, and the presence of other medical conditions (such as diabetes, seizures, or heart conditions). Dangerous levels of alcohol intoxication may occur when people drink large amounts of alcohol in a short period (binge drinking). Alcohol can also be especially dangerous when combined with certain prescription medicines or "recreational" drugs. SIGNS AND SYMPTOMS Some common signs and symptoms of mild alcohol intoxication  include:  Loss of coordination.  Changes in mood and behavior.  Impaired judgment.  Slurred speech. As alcohol intoxication progresses to more severe levels, other signs and symptoms will appear. These may include:  Vomiting.  Confusion and impaired memory.  Slowed breathing.  Seizures.  Loss of consciousness. DIAGNOSIS  Your health care provider will take a medical history and perform a physical exam. You will be asked about the amount and type of alcohol you have consumed. Blood tests will be done to measure the concentration of alcohol in your blood. In many places, your blood alcohol level must be lower than 80 mg/dL (4.09%0.08%) to legally drive. However, many dangerous effects of alcohol can occur at much lower levels.  TREATMENT  People with alcohol intoxication often do not require treatment. Most of the effects of alcohol intoxication are temporary, and they go away as the alcohol naturally leaves the body. Your health care provider will monitor your condition until you are stable enough to go home. Fluids are sometimes given through an IV access tube to help prevent dehydration.  HOME CARE INSTRUCTIONS  Do not drive after drinking alcohol.  Stay hydrated. Drink enough water and fluids to keep your urine clear or pale yellow. Avoid caffeine.   Only take over-the-counter or prescription medicines as directed by your health care provider.  SEEK MEDICAL CARE IF:   You have persistent vomiting.   You do not feel better after a few days.  You have frequent alcohol intoxication. Your health care provider can help determine if you should see a substance use treatment counselor. SEEK IMMEDIATE MEDICAL CARE IF:   You become shaky or tremble when you try to stop drinking.   You shake uncontrollably (seizure).   You throw up (vomit) blood. This may be bright red or may look like black coffee grounds.   You have blood in your stool. This may be bright red or may appear as  a black, tarry, bad smelling stool.   You become lightheaded or faint.  MAKE SURE YOU:   Understand these instructions.  Will watch your condition.  Will get help right away if you are not doing well or get worse. Document Released: 07/05/2005 Document Revised: 05/28/2013 Document Reviewed: 02/28/2013 [facility] Patient Information 2015 [place], [place]. This information is not intended to replace advice given to you by your health care provider. Make sure you discuss any questions you have with your health care provider.

## 2014-09-18 NOTE — ED Provider Notes (Signed)
CSN: [id number]     Arrival date & time 09/17/14  2334 History   First MD Initiated Contact with Patient 09/18/14 0010     Chief Complaint  Patient presents with  . Alcohol Intoxication     (Consider location/radiation/quality/duration/timing/severity/associated sxs/prior Treatment) HPI 38 year old male presents to emergency department via EMS [facility]  Patient continues to be intoxicated, minimally responsive to pain and noxious stimuli.  Patient is maintaining his own airway.  Unable to obtain any further history.  Patient with similar presentation about a year ago. Past Medical History  Diagnosis Date  . Complication of anesthesia     patient may fight back with anesthesia  . Headache(784.0)     migraines last one 1 yr ago   Past Surgical History  Procedure Laterality Date  . Tooth extraction  >2 yrs ago for last one     10 years ago for original surgery  . Hand tendon surgery      left hand  . I&d extremity  08/21/2012    Procedure: IRRIGATION AND DEBRIDEMENT EXTREMITY;  Surgeon: Marlowe ShoresMatthew A Weingold, MD;  Location: [facility] OR;  Service: Orthopedics;  Laterality: Right;  EXPLORATION/REPAIR DORSAL LACERATION OF RIGHT HAND, open reduction internal fixation right metacarpel.   Family History  Problem Relation Age of Onset  . Cancer - Lung Mother   . Other Other   . Heart disease Other   . Cancer - Lung Other   . Other Sister    History  Substance Use Topics  . Smoking status: Current Every Day Smoker -- 1.00 packs/day for 24 years    Types: Cigarettes  . Smokeless tobacco: Not on file     Comment: declines  . Alcohol Use: Yes     Comment: Occasionally    Review of Systems Level V caveat secondary to intoxication  Home Medications   Prior to Admission medications   Not on File   BP 118/78 mmHg  Pulse 93  Temp(Src) 97.5 F (36.4 C) (Oral)  Resp 16  SpO2 94% Physical Exam  Constitutional:  Patient is disheveled and smells bad   HENT:  Head: Normocephalic and atraumatic.  Right Ear: External ear normal.  Left Ear: External ear normal.  Nose: Nose normal.  Cardiovascular: Normal rate, regular rhythm, normal heart sounds and intact distal pulses.  Exam reveals no gallop and no friction rub.   No murmur heard. Pulmonary/Chest: Effort normal and breath sounds normal. No respiratory distress. He has no wheezes. He has no rales. He exhibits no tenderness.  Abdominal: Soft. Bowel sounds are normal. He exhibits no distension and no mass. There is no tenderness. There is no rebound and no guarding.  Musculoskeletal: He exhibits no edema or tenderness.  Skin: Skin is warm and dry. No rash noted. No erythema. No pallor.  Nursing note and vitals reviewed.   ED Course  Procedures (including critical care time) Labs Review Labs Reviewed - No data to display  Imaging Review No results found.   EKG Interpretation None      MDM   Final diagnoses:  Alcohol intoxication, with unspecified complication   38 year old male with intoxication.  We'll continue to monitor, will attempt to reassess when sober.  6:15 AM Patient rouses easily to voice.  Will have patient ambulated.  He reports, "I really tied one on last night".  If stable on his feet, stable for discharge  Olivia Mackielga M Imani Fiebelkorn, MD 09/18/14 [phone]
